# Patient Record
Sex: Male | Born: 1995 | Race: White | Hispanic: No | Marital: Single | Smoking: Never smoker
Health system: Northeastern US, Academic
[De-identification: ages and names within clinical notes are randomized; demographics above are authoritative.]

## PROBLEM LIST (undated history)

## (undated) DIAGNOSIS — F909 Attention-deficit hyperactivity disorder, unspecified type: Secondary | ICD-10-CM

---
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
## 2018-11-21 ENCOUNTER — Other Ambulatory Visit: Payer: Self-pay

## 2018-11-21 DIAGNOSIS — Z20822 Contact with and (suspected) exposure to covid-19: Secondary | ICD-10-CM

## 2018-11-23 LAB — NOVEL CORONAVIRUS, NAA: SARS-CoV-2, NAA: DETECTED — AB

## 2019-04-24 ENCOUNTER — Ambulatory Visit: Payer: BC Managed Care – PPO | Attending: Internal Medicine

## 2019-04-24 DIAGNOSIS — Z23 Encounter for immunization: Secondary | ICD-10-CM

## 2019-04-24 NOTE — Progress Notes (Signed)
   Covid-19 Vaccination Clinic  Name:  Xavier [name]    MRN: [id number] DOB: [birth year]  04/24/2019  Mr. Xavier [name] was observed post Covid-19 immunization for 15 minutes without incident. He was provided with Vaccine Information Sheet and instruction to access the V-Safe system.   Mr. Xavier [name] was instructed to call 911 with any severe reactions post vaccine: Marland Kitchen Difficulty breathing  . Swelling of face and throat  . A fast heartbeat  . A bad rash all over body  . Dizziness and weakness   Immunizations Administered    Name Date Dose VIS Date Route   Pfizer COVID-19 Vaccine 04/24/2019  1:55 PM 0.3 mL 01/09/2019 Intramuscular   Manufacturer: [employer], [employer]   Lot: [id number]   NDC: [address]-4

## 2019-05-20 ENCOUNTER — Ambulatory Visit: Payer: BC Managed Care – PPO | Attending: Internal Medicine

## 2019-05-20 DIAGNOSIS — Z23 Encounter for immunization: Secondary | ICD-10-CM

## 2019-05-20 NOTE — Progress Notes (Signed)
   Covid-19 Vaccination Clinic  Name:  Xavier [name]    MRN: [id number] DOB: December 31, 1995  05/20/2019  Xavier [name] was observed post Covid-19 immunization for 15 minutes without incident. He was provided with Vaccine Information Sheet and instruction to access the V-Safe system.   Xavier [name] was instructed to call 911 with any severe reactions post vaccine: Marland Kitchen Difficulty breathing  . Swelling of face and throat  . A fast heartbeat  . A bad rash all over body  . Dizziness and weakness   Immunizations Administered    Name Date Dose VIS Date [name]   Pfizer COVID-19 Vaccine 05/20/2019  2:58 PM 0.3 mL 03/25/2018 Intramuscular   Manufacturer: [employer], [employer]   Lot: [id number]   NDC: [address]-4

## 2020-06-16 ENCOUNTER — Encounter (INDEPENDENT_AMBULATORY_CARE_PROVIDER_SITE_OTHER)

## 2020-06-16 NOTE — Progress Notes (Signed)
-  NEW PATIENT  -ATTEMPTED TO ABSTRACT  -NO DATA FOUND

## 2020-06-20 ENCOUNTER — Ambulatory Visit: Admit: 2020-06-20 | Discharge: 2020-06-20 | Payer: PRIVATE HEALTH INSURANCE | Attending: Optometrist

## 2020-06-20 ENCOUNTER — Encounter (INDEPENDENT_AMBULATORY_CARE_PROVIDER_SITE_OTHER): Admitting: Optometrist

## 2020-06-20 ENCOUNTER — Other Ambulatory Visit

## 2020-06-20 DIAGNOSIS — H5213 Myopia, bilateral: Secondary | ICD-10-CM

## 2020-06-20 NOTE — Progress Notes (Signed)
1. Myopia of both eyes  Rx given    2. Astigmatism of both eyes, unspecified type  Rx given    F/U 1 year/PRN

## 2020-07-04 ENCOUNTER — Institutional Professional Consult (permissible substitution) (INDEPENDENT_AMBULATORY_CARE_PROVIDER_SITE_OTHER): Admitting: Optometrist

## 2021-06-21 ENCOUNTER — Encounter: Payer: PRIVATE HEALTH INSURANCE | Attending: Optometrist

## 2024-01-27 NOTE — Telephone Encounter (Signed)
(  [facility] r/s from Dr Belita on 01/28/2024- left vm @ 2:37pm on 12/29/2025[facility]

## 2024-01-28 ENCOUNTER — Encounter: Payer: PRIVATE HEALTH INSURANCE | Attending: Optometrist

## 2024-03-17 ENCOUNTER — Encounter: Payer: PRIVATE HEALTH INSURANCE | Attending: Optometrist

## 2024-03-18 ENCOUNTER — Encounter: Payer: PRIVATE HEALTH INSURANCE | Attending: Optometrist
